# Patient Record
Sex: Female | Born: 1990 | Race: Black or African American | Hispanic: No | Marital: Single | Smoking: Never smoker
Health system: Southern US, Community
[De-identification: ages and names within clinical notes are randomized; demographics above are authoritative.]

---

## 2015-02-09 ENCOUNTER — Emergency Department (HOSPITAL_COMMUNITY)
Admission: EM | Admit: 2015-02-09 | Discharge: 2015-02-10 | Disposition: A | Discharge status: Home / Self Care | Payer: No Typology Code available for payment source | Attending: Emergency Medicine | Admitting: Emergency Medicine

## 2015-02-09 ENCOUNTER — Encounter (HOSPITAL_COMMUNITY): Payer: Self-pay | Admitting: Emergency Medicine

## 2015-02-09 DIAGNOSIS — Y9389 Activity, other specified: Secondary | ICD-10-CM | POA: Insufficient documentation

## 2015-02-09 DIAGNOSIS — Z3202 Encounter for pregnancy test, result negative: Secondary | ICD-10-CM | POA: Insufficient documentation

## 2015-02-09 DIAGNOSIS — Y998 Other external cause status: Secondary | ICD-10-CM | POA: Diagnosis not present

## 2015-02-09 DIAGNOSIS — S59902A Unspecified injury of left elbow, initial encounter: Secondary | ICD-10-CM | POA: Diagnosis present

## 2015-02-09 DIAGNOSIS — S5002XA Contusion of left elbow, initial encounter: Secondary | ICD-10-CM | POA: Diagnosis not present

## 2015-02-09 DIAGNOSIS — Y9289 Other specified places as the place of occurrence of the external cause: Secondary | ICD-10-CM | POA: Diagnosis not present
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
## 2015-02-10 ENCOUNTER — Emergency Department (HOSPITAL_COMMUNITY): Payer: No Typology Code available for payment source

## 2015-02-10 LAB — POC URINE PREG, ED: Preg Test, Ur: NEGATIVE

## 2015-02-10 MED ORDER — IBUPROFEN 800 MG PO TABS: 800.0000 mg | ORAL_TABLET | Freq: Three times a day (TID) | ORAL | Status: AC

## 2015-02-10 MED ORDER — IBUPROFEN 800 MG PO TABS
800.0000 mg | ORAL_TABLET | Freq: Once | ORAL | Status: AC
  Administered 2015-02-10: 800 mg via ORAL
  Filled 2015-02-10: qty 1

## 2015-02-10 MED ORDER — TRAMADOL HCL 50 MG PO TABS
50.0000 mg | ORAL_TABLET | Freq: Four times a day (QID) | ORAL | Status: AC | PRN
Start: 2015-02-10 — End: ?

## 2015-02-10 MED ORDER — TRAMADOL HCL 50 MG PO TABS
50.0000 mg | ORAL_TABLET | Freq: Once | ORAL | Status: AC
  Administered 2015-02-10: 50 mg via ORAL
  Filled 2015-02-10: qty 1

## 2015-02-10 NOTE — Discharge Instructions (Signed)
Cryotherapy °Cryotherapy means treatment with cold. Ice or gel packs can be used to reduce both pain and swelling. Ice is the most helpful within the first 24 to 48 hours after an injury or flare-up from overusing a muscle or joint. Sprains, strains, spasms, burning pain, shooting pain, and aches can all be eased with ice. Ice can also be used when recovering from surgery. Ice is effective, has very few side effects, and is safe for most people to use. °PRECAUTIONS  °Ice is not a safe treatment option for people with: °· Raynaud phenomenon. This is a condition affecting small blood vessels in the extremities. Exposure to cold may cause your problems to return. °· Cold hypersensitivity. There are many forms of cold hypersensitivity, including: °¨ Cold urticaria. Red, itchy hives appear on the skin when the tissues begin to warm after being iced. °¨ Cold erythema. This is a red, itchy rash caused by exposure to cold. °¨ Cold hemoglobinuria. Red blood cells break down when the tissues begin to warm after being iced. The hemoglobin that carry oxygen are passed into the urine because they cannot combine with blood proteins fast enough. °· Numbness or altered sensitivity in the area being iced. °If you have any of the following conditions, do not use ice until you have discussed cryotherapy with your caregiver: °· Heart conditions, such as arrhythmia, angina, or chronic heart disease. °· High blood pressure. °· Healing wounds or open skin in the area being iced. °· Current infections. °· Rheumatoid arthritis. °· Poor circulation. °· Diabetes. °Ice slows the blood flow in the region it is applied. This is beneficial when trying to stop inflamed tissues from spreading irritating chemicals to surrounding tissues. However, if you expose your skin to cold temperatures for too long or without the proper protection, you can damage your skin or nerves. Watch for signs of skin damage due to cold. °HOME CARE INSTRUCTIONS °Follow  these tips to use ice and cold packs safely. °· Place a dry or damp towel between the ice and skin. A damp towel will cool the skin more quickly, so you may need to shorten the time that the ice is used. °· For a more rapid response, add gentle compression to the ice. °· Ice for no more than 10 to 20 minutes at a time. The bonier the area you are icing, the less time it will take to get the benefits of ice. °· Check your skin after 5 minutes to make sure there are no signs of a poor response to cold or skin damage. °· Rest 20 minutes or more between uses. °· Once your skin is numb, you can end your treatment. You can test numbness by very lightly touching your skin. The touch should be so light that you do not see the skin dimple from the pressure of your fingertip. When using ice, most people will feel these normal sensations in this order: cold, burning, aching, and numbness. °· Do not use ice on someone who cannot communicate their responses to pain, such as small children or people with dementia. °HOW TO MAKE AN ICE PACK °Ice packs are the most common way to use ice therapy. Other methods include ice massage, ice baths, and cryosprays. Muscle creams that cause a cold, tingly feeling do not offer the same benefits that ice offers and should not be used as a substitute unless recommended by your caregiver. °To make an ice pack, do one of the following: °· Place crushed ice or a   bag of frozen vegetables in a sealable plastic bag. Squeeze out the excess air. Place this bag inside another plastic bag. Slide the bag into a pillowcase or place a damp towel between your skin and the bag. °· Mix 3 parts water with 1 part rubbing alcohol. Freeze the mixture in a sealable plastic bag. When you remove the mixture from the freezer, it will be slushy. Squeeze out the excess air. Place this bag inside another plastic bag. Slide the bag into a pillowcase or place a damp towel between your skin and the bag. °SEEK MEDICAL CARE  IF: °· You develop white spots on your skin. This may give the skin a blotchy (mottled) appearance. °· Your skin turns blue or pale. °· Your skin becomes waxy or hard. °· Your swelling gets worse. °MAKE SURE YOU:  °· Understand these instructions. °· Will watch your condition. °· Will get help right away if you are not doing well or get worse. °Document Released: 08/08/2011 Document Revised: 04/28/2014 Document Reviewed: 08/08/2011 °[employer]® Patient Information ©2015 [employer]. This information is not intended to replace advice given to you by your health care provider. Make sure you discuss any questions you have with your health care provider. ° °

## 2015-02-10 NOTE — ED Notes (Signed)
POC preg. Result- Negative
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
